# Patient Record
Sex: Male | Born: 1940 | Race: White | Hispanic: No | Marital: Single
Health system: Southern US, Community
[De-identification: ages and names within clinical notes are randomized; demographics above are authoritative.]

---

## 2008-11-28 ENCOUNTER — Inpatient Hospital Stay: Admission: RE | Admit: 2008-11-28 | Discharge: 2009-01-05 | Payer: Self-pay | Admitting: Internal Medicine

## 2008-12-23 ENCOUNTER — Encounter (INDEPENDENT_AMBULATORY_CARE_PROVIDER_SITE_OTHER): Payer: Self-pay | Admitting: Internal Medicine

## 2008-12-23 ENCOUNTER — Ambulatory Visit: Payer: Self-pay | Admitting: Vascular Surgery

## 2008-12-24 ENCOUNTER — Ambulatory Visit: Payer: Self-pay | Admitting: Internal Medicine

## 2010-05-31 ENCOUNTER — Encounter: Payer: Self-pay | Admitting: Internal Medicine

## 2010-08-14 LAB — VANCOMYCIN, TROUGH: Vancomycin Tr: 14.7 ug/mL (ref 10.0–20.0)

## 2010-08-14 LAB — BASIC METABOLIC PANEL
BUN: 24 mg/dL — ABNORMAL HIGH (ref 6–23)
BUN: 28 mg/dL — ABNORMAL HIGH (ref 6–23)
Chloride: 104 mEq/L (ref 96–112)
Creatinine, Ser: 1.53 mg/dL — ABNORMAL HIGH (ref 0.4–1.5)
Creatinine, Ser: 1.85 mg/dL — ABNORMAL HIGH (ref 0.4–1.5)
GFR calc Af Amer: 55 mL/min — ABNORMAL LOW (ref >60)
GFR calc non Af Amer: 37 mL/min — ABNORMAL LOW (ref >60)
GFR calc non Af Amer: 46 mL/min — ABNORMAL LOW (ref >60)
Glucose, Bld: 106 mg/dL — ABNORMAL HIGH (ref 70–99)
Potassium: 3.7 mEq/L (ref 3.5–5.1)

## 2010-08-14 LAB — CBC
HCT: 26.9 % — ABNORMAL LOW (ref 39.0–52.0)
Hemoglobin: 8 g/dL — ABNORMAL LOW (ref 13.0–17.0)
MCHC: 32.9 g/dL (ref 30.0–36.0)
MCHC: 33.5 g/dL (ref 30.0–36.0)
MCV: 92 fL (ref 78.0–100.0)
MCV: 93.7 fL (ref 78.0–100.0)
Platelets: 243 10*3/uL (ref 150–400)
Platelets: 246 10*3/uL (ref 150–400)
Platelets: 259 10*3/uL (ref 150–400)
RBC: 2.57 MIL/uL — ABNORMAL LOW (ref 4.22–5.81)
RBC: 2.89 MIL/uL — ABNORMAL LOW (ref 4.22–5.81)
RDW: 13.8 % (ref 11.5–15.5)
RDW: 13.9 % (ref 11.5–15.5)
WBC: 6.2 10*3/uL (ref 4.0–10.5)
WBC: 6.9 10*3/uL (ref 4.0–10.5)

## 2010-08-14 LAB — CULTURE, BLOOD (ROUTINE X 2)
Culture: NO GROWTH
Culture: NO GROWTH

## 2010-08-14 LAB — CATH TIP CULTURE: Culture: >100

## 2010-08-15 LAB — CROSSMATCH
ABO/RH(D): O NEG
Antibody Screen: NEGATIVE

## 2010-08-15 LAB — DIFFERENTIAL
Basophils Absolute: 0 10*3/uL (ref 0.0–0.1)
Basophils Relative: 0 % (ref 0–1)
Eosinophils Absolute: 0.2 10*3/uL (ref 0.0–0.7)
Eosinophils Absolute: 0.2 10*3/uL (ref 0.0–0.7)
Eosinophils Relative: 3 % (ref 0–5)
Eosinophils Relative: 3 % (ref 0–5)
Lymphocytes Relative: 18 % (ref 12–46)
Lymphs Abs: 1.7 10*3/uL (ref 0.7–4.0)
Lymphs Abs: 1.7 10*3/uL (ref 0.7–4.0)
Monocytes Absolute: 0.4 10*3/uL (ref 0.1–1.0)
Monocytes Relative: 4 % (ref 3–12)
Monocytes Relative: 5 % (ref 3–12)
Neutro Abs: 6.7 10*3/uL (ref 1.7–7.7)
Neutrophils Relative %: 75 % (ref 43–77)

## 2010-08-15 LAB — BASIC METABOLIC PANEL
BUN: 13 mg/dL (ref 6–23)
BUN: 24 mg/dL — ABNORMAL HIGH (ref 6–23)
CO2: 29 mEq/L (ref 19–32)
Calcium: 8.1 mg/dL — ABNORMAL LOW (ref 8.4–10.5)
Calcium: 8.5 mg/dL (ref 8.4–10.5)
Chloride: 113 mEq/L — ABNORMAL HIGH (ref 96–112)
Creatinine, Ser: 1.09 mg/dL (ref 0.4–1.5)
Creatinine, Ser: 1.37 mg/dL (ref 0.4–1.5)
GFR calc Af Amer: >60 mL/min (ref >60)
GFR calc non Af Amer: 52 mL/min — ABNORMAL LOW (ref >60)
GFR calc non Af Amer: >60 mL/min (ref >60)
Glucose, Bld: 112 mg/dL — ABNORMAL HIGH (ref 70–99)
Potassium: 4 mEq/L (ref 3.5–5.1)
Potassium: 4.3 mEq/L (ref 3.5–5.1)
Sodium: 147 mEq/L — ABNORMAL HIGH (ref 135–145)

## 2010-08-15 LAB — CBC
HCT: 21.3 % — ABNORMAL LOW (ref 39.0–52.0)
HCT: 24.2 % — ABNORMAL LOW (ref 39.0–52.0)
HCT: 26.4 % — ABNORMAL LOW (ref 39.0–52.0)
Hemoglobin: 6.9 g/dL — CL (ref 13.0–17.0)
Hemoglobin: 8.8 g/dL — ABNORMAL LOW (ref 13.0–17.0)
MCHC: 32.5 g/dL (ref 30.0–36.0)
MCHC: 33.7 g/dL (ref 30.0–36.0)
MCHC: 34.7 g/dL (ref 30.0–36.0)
MCV: 94 fL (ref 78.0–100.0)
MCV: 94.4 fL (ref 78.0–100.0)
MCV: 94.9 fL (ref 78.0–100.0)
MCV: 94.9 fL (ref 78.0–100.0)
Platelets: 211 10*3/uL (ref 150–400)
Platelets: 268 10*3/uL (ref 150–400)
Platelets: 361 10*3/uL (ref 150–400)
RBC: 2.25 MIL/uL — ABNORMAL LOW (ref 4.22–5.81)
RBC: 2.81 MIL/uL — ABNORMAL LOW (ref 4.22–5.81)
RDW: 14.9 % (ref 11.5–15.5)
RDW: 15.2 % (ref 11.5–15.5)
RDW: 15.2 % (ref 11.5–15.5)
RDW: 16 % — ABNORMAL HIGH (ref 11.5–15.5)
WBC: 6.7 10*3/uL (ref 4.0–10.5)
WBC: 9 10*3/uL (ref 4.0–10.5)
WBC: 9.5 10*3/uL (ref 4.0–10.5)

## 2010-08-15 LAB — VANCOMYCIN, TROUGH
Vancomycin Tr: 7.6 ug/mL — ABNORMAL LOW (ref 10.0–20.0)
Vancomycin Tr: 8.3 ug/mL — ABNORMAL LOW (ref 10.0–20.0)

## 2010-08-15 LAB — CULTURE, BLOOD (ROUTINE X 2)
Culture: NO GROWTH
Culture: NO GROWTH

## 2010-08-15 LAB — COMPREHENSIVE METABOLIC PANEL
ALT: 48 U/L (ref 0–53)
AST: 54 U/L — ABNORMAL HIGH (ref 0–37)
Albumin: 2.1 g/dL — ABNORMAL LOW (ref 3.5–5.2)
Alkaline Phosphatase: 206 U/L — ABNORMAL HIGH (ref 39–117)
BUN: 25 mg/dL — ABNORMAL HIGH (ref 6–23)
CO2: 28 mEq/L (ref 19–32)
Calcium: 8 mg/dL — ABNORMAL LOW (ref 8.4–10.5)
Chloride: 119 mEq/L — ABNORMAL HIGH (ref 96–112)
Creatinine, Ser: 1.3 mg/dL (ref 0.4–1.5)
GFR calc Af Amer: >60 mL/min (ref >60)
GFR calc non Af Amer: 55 mL/min — ABNORMAL LOW (ref >60)
Glucose, Bld: 115 mg/dL — ABNORMAL HIGH (ref 70–99)
Potassium: 3.9 mEq/L (ref 3.5–5.1)
Sodium: 152 mEq/L — ABNORMAL HIGH (ref 135–145)
Total Bilirubin: 0.3 mg/dL (ref 0.3–1.2)
Total Protein: 5.8 g/dL — ABNORMAL LOW (ref 6.0–8.3)

## 2010-08-15 LAB — URINALYSIS, MICROSCOPIC ONLY
Bilirubin Urine: NEGATIVE
Glucose, UA: NEGATIVE mg/dL
Ketones, ur: NEGATIVE mg/dL
pH: 6 (ref 5.0–8.0)

## 2010-08-15 LAB — ABO/RH: ABO/RH(D): O NEG
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
## 2015-04-07 ENCOUNTER — Telehealth: Payer: Self-pay

## 2015-04-07 NOTE — Telephone Encounter (Signed)
PA initiated via covermymeds. WUJ:[id number]:[id number]

## 2017-02-06 DEATH — deceased
